# Patient Record
Sex: Male | Born: 1960 | Race: White | Hispanic: No | Marital: Single | Smoking: Current every day smoker
Health system: Southern US, Community
[De-identification: ages and names within clinical notes are randomized; demographics above are authoritative.]

---

## 2015-08-08 ENCOUNTER — Emergency Department (HOSPITAL_COMMUNITY)
Admission: EM | Admit: 2015-08-08 | Discharge: 2015-08-08 | Disposition: A | Discharge status: Home / Self Care | Payer: Non-veteran care | Attending: Emergency Medicine | Admitting: Emergency Medicine

## 2015-08-08 ENCOUNTER — Encounter (HOSPITAL_COMMUNITY): Payer: Self-pay | Admitting: Emergency Medicine

## 2015-08-08 ENCOUNTER — Emergency Department (HOSPITAL_COMMUNITY): Payer: Self-pay

## 2015-08-08 DIAGNOSIS — Y9289 Other specified places as the place of occurrence of the external cause: Secondary | ICD-10-CM | POA: Insufficient documentation

## 2015-08-08 DIAGNOSIS — W1789XA Other fall from one level to another, initial encounter: Secondary | ICD-10-CM | POA: Insufficient documentation

## 2015-08-08 DIAGNOSIS — Y9389 Activity, other specified: Secondary | ICD-10-CM | POA: Insufficient documentation

## 2015-08-08 DIAGNOSIS — F17210 Nicotine dependence, cigarettes, uncomplicated: Secondary | ICD-10-CM | POA: Insufficient documentation

## 2015-08-08 DIAGNOSIS — M4850XA Collapsed vertebra, not elsewhere classified, site unspecified, initial encounter for fracture: Secondary | ICD-10-CM

## 2015-08-08 DIAGNOSIS — S22060A Wedge compression fracture of T7-T8 vertebra, initial encounter for closed fracture: Secondary | ICD-10-CM | POA: Insufficient documentation

## 2015-08-08 DIAGNOSIS — IMO0001 Reserved for inherently not codable concepts without codable children: Secondary | ICD-10-CM

## 2015-08-08 DIAGNOSIS — Y998 Other external cause status: Secondary | ICD-10-CM | POA: Insufficient documentation

## 2015-08-08 MED ORDER — HYDROCODONE-ACETAMINOPHEN 5-325 MG PO TABS
1.0000 | ORAL_TABLET | ORAL | Status: AC | PRN
Start: 2015-08-08 — End: ?

## 2015-08-08 MED ORDER — NAPROXEN 250 MG PO TABS: 250.0000 mg | ORAL_TABLET | Freq: Two times a day (BID) | ORAL | Status: AC

## 2015-08-08 NOTE — ED Notes (Signed)
Patient moving around very well, full ROM.   Patient states had initial fall in October off the back of a porch, was never seen for injury.

## 2015-08-08 NOTE — Discharge Instructions (Signed)
Vertebral Fracture A vertebral fracture is a break in one of the bones that make up the spine (vertebrae). The vertebrae are stacked on top of each other to form the spinal column. They support the body and protect the spinal cord. The vertebral column has an upper part (cervical spine), a middle part (thoracic spine), and a lower part (lumbar spine). Most vertebral fractures occur in the thoracic spine or lumbar spine. There are three main types of vertebral fractures:  Flexion fracture. This happens when vertebrae collapse. Vertebrae can collapse:  In the front (compression fracture). This type of fracture is common in people who have a condition that causes their bones to be weak and brittle (osteoporosis). The fracture can make a person lose height.  In the front and back (axial burst fracture).  Extension fracture. This happens when an external force pulls apart the vertebrae.  Rotation fracture. This happens when the spine bends extremely in one direction. This type can cause a piece of a vertebra to break off (transverse process fracture) or move out of its normal position (fracture dislocation). This type of fracture has a high risk for spinal cord injury. Vertebral fractures can range from mild to very severe. The most severe types are those that cause the broken bones to move out of place (unstable) and those that injure or press on the spinal cord. CAUSES This condition is usually caused by a forceful injury. This type of injury commonly results from:  Car accidents.  Falling or jumping from a great height.  Collisions in contact sports.  Violent acts, such as an assault or a gunshot wound. RISK FACTORS This injury is more likely to happen to people who:  Have osteoporosis.  Participate in contact sports.  Are in situations that could result in falls or other violent injuries. SYMPTOMS Symptoms of this injury depend on the location and the type of fracture. The most common  symptom is back pain that gets worse with movement. You may also have trouble standing or walking. If a fracture has damaged your spinal cord or is pressing on it, you may also have:  Numbness.  Tingling.  Weakness.  Loss of movement.  Loss of bowel or bladder control. DIAGNOSIS This injury may be diagnosed based on symptoms, medical history, and a physical exam. You may also have imaging tests to confirm the diagnosis. These may include:  Spine X-ray.  CT scan.  MRI. TREATMENT Treatment for this injury depends on the type of fracture. If your fracture is stable and does not affect your spinal cord, it may heal with nonsurgical treatment, such as:  Taking pain medicine.  Wearing a cast or a brace.  Doing physical therapy exercises. If your vertebral fracture is unstable or it affects your spinal cord, you may need surgical treatment, such as:  Laminectomy. This procedure involves removing the part of a vertebra that is pushing on the spinal cord (spinal decompression surgery). Bone fragments may also be removed.  Spinal fusion. This procedure is used to stabilize an unstable fracture. Vertebrae may be joined together with a piece of bone from another part of your body (graft) and held in place with rods, plates, or screws.  Vertebroplasty. In this procedure, bone cement is used to rebuild collapsed vertebrae. HOME CARE INSTRUCTIONS General Instructions  Take medicines only as directed by your health care provider.  Do not drive or operate heavy machinery while taking pain medicine.  If directed, apply ice to the injured area:  Put  ice in a plastic bag.  Place a towel between your skin and the bag.  Leave the ice on for 30 minutes every two hours at first. Then apply the ice as needed.  Wear your neck brace or back brace as directed by your health care provider.  Do not drink alcohol. Alcohol can interfere with your treatment.  Keep all follow-up visits as directed  by your health care provider. This is important. It can help to prevent permanent injury, disability, and long-lasting (chronic) pain. Activity  Stay in bed (on bed rest) only as directed by your health care provider. Being on bed rest for too long can make your condition worse.  Return to your normal activities as directed by your health care provider. Ask what activities are safe for you.  Do exercises to improve motion and strength in your back (physical therapy), as recommended by your health care provider.   Exercise regularly as directed by your health care provider. SEEK MEDICAL CARE IF:  You have a fever.  You develop a cough that makes your pain worse.  Your pain medicine is not helping.  Your pain does not get better over time.  You cannot return to your normal activities as planned or expected. SEEK IMMEDIATE MEDICAL CARE IF:  Your pain is very bad and it suddenly gets worse.  You are unable to move any body part (paralysis) that is below the level of your injury.  You have numbness, tingling, or weakness in any body part that is below the level of your injury.  You cannot control your bladder or bowels.   This information is not intended to replace advice given to you by your health care provider. Make sure you discuss any questions you have with your health care provider.   Document Released: 08/15/2004 Document Revised: 11/22/2014 Document Reviewed: 07/13/2014 Elsevier Interactive Patient Education 2016 [employer] Back Pain, Adult Back pain is very common in adults.The cause of back pain is rarely dangerous and the pain often gets better over time.The cause of your back pain may not be known. Some common causes of back pain include:  Strain of the muscles or ligaments supporting the spine.  Wear and tear (degeneration) of the spinal disks.  Arthritis.  Direct injury to the back. For many people, back pain may return. Since back pain is rarely  dangerous, most people can learn to manage this condition on their own. HOME CARE INSTRUCTIONS Watch your back pain for any changes. The following actions may help to lessen any discomfort you are feeling:  Remain active. It is stressful on your back to sit or stand in one place for long periods of time. Do not sit, drive, or stand in one place for more than 30 minutes at a time. Take short walks on even surfaces as soon as you are able.Try to increase the length of time you walk each day.  Exercise regularly as directed by your health care provider. Exercise helps your back heal faster. It also helps avoid future injury by keeping your muscles strong and flexible.  Do not stay in bed.Resting more than 1-2 days can delay your recovery.  Pay attention to your body when you bend and lift. The most comfortable positions are those that put less stress on your recovering back. Always use proper lifting techniques, including:  Bending your knees.  Keeping the load close to your body.  Avoiding twisting.  Find a comfortable position to sleep. Use a firm mattress and  lie on your side with your knees slightly bent. If you lie on your back, put a pillow under your knees.  Avoid feeling anxious or stressed.Stress increases muscle tension and can worsen back pain.It is important to recognize when you are anxious or stressed and learn ways to manage it, such as with exercise.  Take medicines only as directed by your health care provider. Over-the-counter medicines to reduce pain and inflammation are often the most helpful.Your health care provider may prescribe muscle relaxant drugs.These medicines help dull your pain so you can more quickly return to your normal activities and healthy exercise.  Apply ice to the injured area:  Put ice in a plastic bag.  Place a towel between your skin and the bag.  Leave the ice on for 20 minutes, 2-3 times a day for the first 2-3 days. After that, ice and  heat may be alternated to reduce pain and spasms.  Maintain a healthy weight. Excess weight puts extra stress on your back and makes it difficult to maintain good posture. SEEK MEDICAL CARE IF:  You have pain that is not relieved with rest or medicine.  You have increasing pain going down into the legs or buttocks.  You have pain that does not improve in one week.  You have night pain.  You lose weight.  You have a fever or chills. SEEK IMMEDIATE MEDICAL CARE IF:   You develop new bowel or bladder control problems.  You have unusual weakness or numbness in your arms or legs.  You develop nausea or vomiting.  You develop abdominal pain.  You feel faint.   This information is not intended to replace advice given to you by your health care provider. Make sure you discuss any questions you have with your health care provider.   Document Released: 07/08/2005 Document Revised: 07/29/2014 Document Reviewed: 11/09/2013 Elsevier Interactive Patient Education [employer]

## 2015-08-08 NOTE — ED Notes (Signed)
Pt sts mid back pain x 4 months since falling off porch

## 2015-08-08 NOTE — ED Provider Notes (Signed)
CSN: [id number]     Arrival date & time 08/08/15  0802 History  By signing my name below, I, William Vaughn, attest that this documentation has been prepared under the direction and in the presence of non-physician practitioner, Everlene Farrier, PA-C. Electronically Signed: [name], Scribe. 08/08/2015. 9:20 AM.    Chief Complaint  Patient presents with  . Back Pain    The history is provided by the patient. No language interpreter was used.     HPI Comments:  William Vaughn is a 55 y.o. male who presents to the Emergency Department complaining of 10/10 back pain x 3 months.Pt fell backwards off a porch in October 2016. He has taken nothing for treatment today. He has not been evaluated for his back pain yet. He denies numbness and tingling in his extremities, bowel/bladder incontinence, dysuria, hematuria, vomiting, diarrhea and fever. He also denies h/o IVDA and h/o CA. No alleviating factors noted; no treatments tried. Pt has not been evaluated since the fall.    History reviewed. No pertinent past medical history. History reviewed. No pertinent past surgical history. History reviewed. No pertinent family history. Social History  Substance Use Topics  . Smoking status: Current Every Day Smoker  . Smokeless tobacco: None  . Alcohol Use: Yes    Review of Systems  Constitutional: Negative for fever and chills.  Respiratory: Negative for shortness of breath.   Cardiovascular: Negative for chest pain.  Gastrointestinal: Negative for vomiting and diarrhea.  Genitourinary: Negative for dysuria, hematuria and difficulty urinating.  Musculoskeletal: Positive for back pain. Negative for gait problem and neck pain.  Skin: Negative for rash and wound.  Neurological: Negative for weakness and numbness.    Allergies  Review of patient's allergies indicates no known allergies.  Home Medications   Prior to Admission medications   Medication Sig Start Date End Date Taking? Authorizing  Provider  HYDROcodone-acetaminophen (NORCO/VICODIN) 5-325 MG tablet Take 1 tablet by mouth every 4 (four) hours as needed for moderate pain or severe pain. 08/08/15   Everlene Farrier, PA-C  naproxen (NAPROSYN) 250 MG tablet Take 1 tablet (250 mg total) by mouth 2 (two) times daily with a meal. 08/08/15   Everlene Farrier, PA-C   BP 105/70 mmHg  Pulse 90  Temp(Src) 97.9 F (36.6 C) (Oral)  Resp 18  SpO2 99% Physical Exam  Constitutional: He is oriented to person, place, and time. He appears well-developed and well-nourished. No distress.  Nontoxic appearing.  HENT:  Head: Normocephalic and atraumatic.  Mouth/Throat: Oropharynx is clear and moist.  Eyes: Conjunctivae are normal. Pupils are equal, round, and reactive to light. Right eye exhibits no discharge. Left eye exhibits no discharge.  Neck: Normal range of motion. Neck supple.  Cardiovascular: Normal rate, regular rhythm, normal heart sounds and intact distal pulses.   Pulmonary/Chest: Effort normal and breath sounds normal. No respiratory distress. He has no wheezes. He has no rales.  Abdominal: Soft. There is no tenderness.  Musculoskeletal: Normal range of motion. He exhibits tenderness. He exhibits no edema.  Mild Tenderness noted to thoracic spine  No lumbar spine tenderness  5/5 strength to BLE; nml gait Moderate kyphosis  No erythema or edema.  Lymphadenopathy:    He has no cervical adenopathy.  Neurological: He is alert and oriented to person, place, and time. He has normal reflexes. He displays normal reflexes. Coordination normal.  Reflex Scores:      Patellar reflexes are 2+ on the right side and 2+ on the left side. 55  strength in his bilateral lower extremities. Normal gait. Bilateral patellar DTRs are intact.  Skin: Skin is warm and dry. No rash noted. He is not diaphoretic. No erythema. No pallor.  Psychiatric: He has a normal mood and affect. His behavior is normal.  Nursing note and vitals reviewed.   ED Course   Procedures   DIAGNOSTIC STUDIES:  Oxygen Saturation is 97% on RA, normal by my interpretation.    COORDINATION OF CARE:  9:16 AM Pt updated with results. Discussed treatment plan with pt at bedside and pt agreed to plan.   Imaging Review Dg Thoracic Spine 2 View  08/08/2015  CLINICAL DATA:  Mid back pain status post fall from the porch. EXAM: THORACIC SPINE 2 VIEWS COMPARISON:  None. FINDINGS: There are age-indeterminate T8 and T9 vertebral body compression fractures with approximately 80% anterior height loss. The remainder the vertebral body heights are maintained. There is relative kyphosis centered at T8-9. Alignment is normal. No other significant bone abnormalities are identified. IMPRESSION: 1. Age-indeterminate T8 and T9 vertebral body compression fractures with approximately 80% anterior height loss. Electronically Signed   By: Elige Ko   On: 08/08/2015 08:53   Dg Lumbar Spine Complete  08/08/2015  CLINICAL DATA:  Mid back pain 4 months since falling off a porch EXAM: LUMBAR SPINE - COMPLETE 4+ VIEW COMPARISON:  None. FINDINGS: There is no evidence of lumbar spine fracture. There is 3 mm scratch at there is 5 mm of retrolisthesis of L2 on L3. There is mild degenerative disc disease at L2-3. IMPRESSION: No acute osseous injury of the lumbar spine. Electronically Signed   By: Elige Ko   On: 08/08/2015 08:52   I have personally reviewed and evaluated these images as part of my medical decision-making.     MDM   Meds given in ED:  Medications - No data to display  New Prescriptions   HYDROCODONE-ACETAMINOPHEN (NORCO/VICODIN) 5-325 MG TABLET    Take 1 tablet by mouth every 4 (four) hours as needed for moderate pain or severe pain.   NAPROXEN (NAPROSYN) 250 MG TABLET    Take 1 tablet (250 mg total) by mouth 2 (two) times daily with a meal.    Final diagnoses:  Vertebral compression fracture, initial encounter [facility])   This  is a 55 y.o. male who presents to the  Emergency Department complaining of 10/10 back pain x 3 months.Pt fell backwards off a porch in October 2016. He has taken nothing for treatment today. He has not been evaluated for his back pain yet. He denies numbness and tingling in his extremities, bowel/bladder incontinence.  On exam the patient is afebrile nontoxic appearing. He has no focal neurological deficits. He is able to ambulate with normal gait. He has moderate kyphosis on exam. Is some mild thoracic back tenderness to palpation. No back erythema or edema. Patient had lumbar and thoracic x-rays. Thoracic x-ray indicates T8 and T9 vertebral body compression fractures with approximately 80% anterior height loss. Will start patient on Naprosyn and provide with Norco for breakthrough pain. Will provide with follow-up with orthopedic surgeon Dr. Ophelia Charter. I discussed strict return precautions. I advised the patient to follow-up with their primary care provider this week. I advised the patient to return to the emergency department with new or worsening symptoms or new concerns. The patient verbalized understanding and agreement with plan.   This patient was discussed with Dr. Effie Shy who agrees with assessment and plan.  I personally performed the services described in this  documentation, which was scribed in my presence. The recorded information has been reviewed and is accurate.      Everlene Farrier, PA-C 08/08/15 1610  Mancel Bale, MD 08/08/15 [phone]

## 2016-07-04 IMAGING — DX DG LUMBAR SPINE COMPLETE 4+V
6 series · 6 of 6 positions shown · non-contrast
Comparison: None.

CLINICAL DATA: Mid back pain 4 months since falling off a porch

EXAM:
LUMBAR SPINE - COMPLETE 4+ VIEW

[t lumbar spine ap]
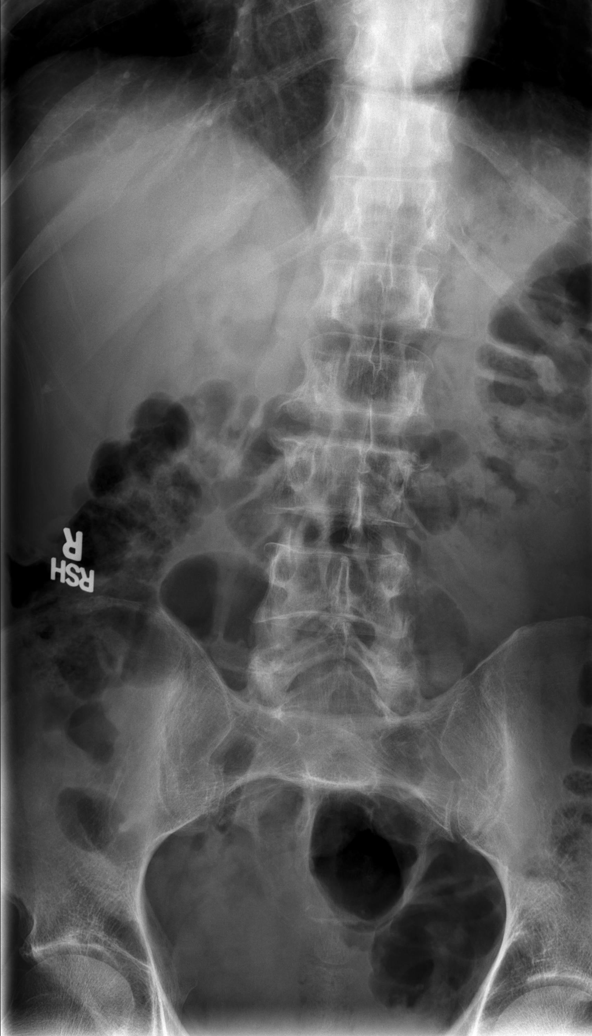

[t lumbar spine obl (1 of 3)]
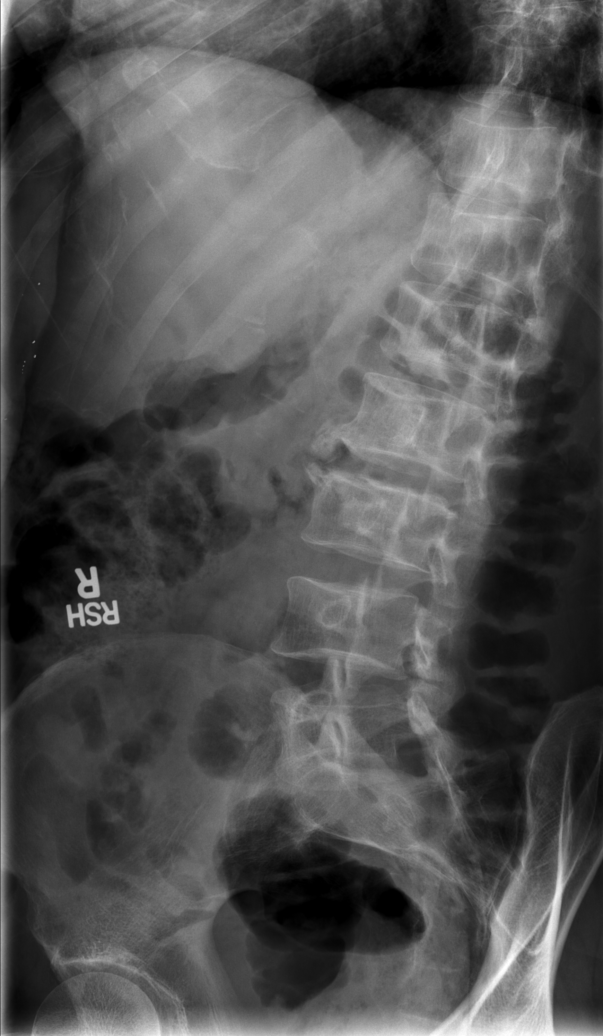

[t lumbar spine obl (2 of 3)]
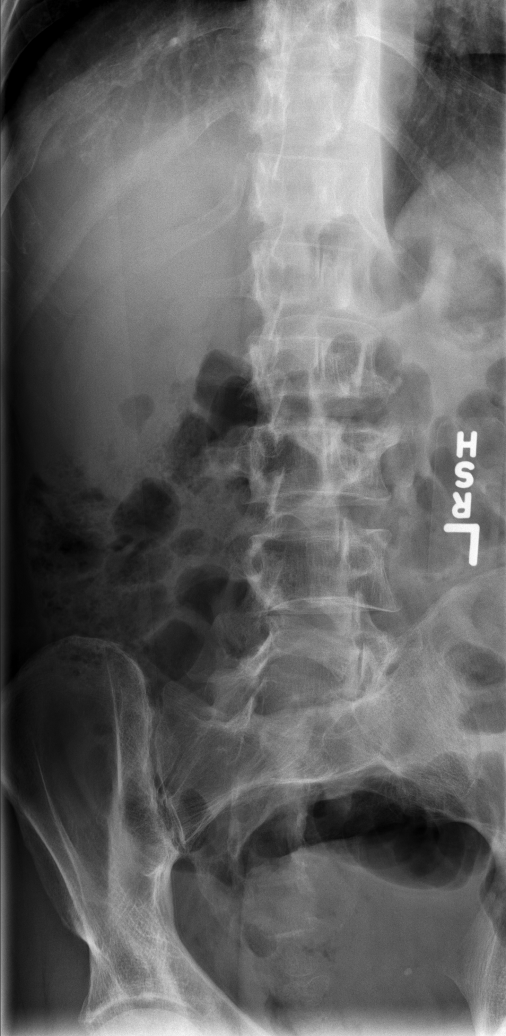

[t lumbar spine obl (3 of 3)]
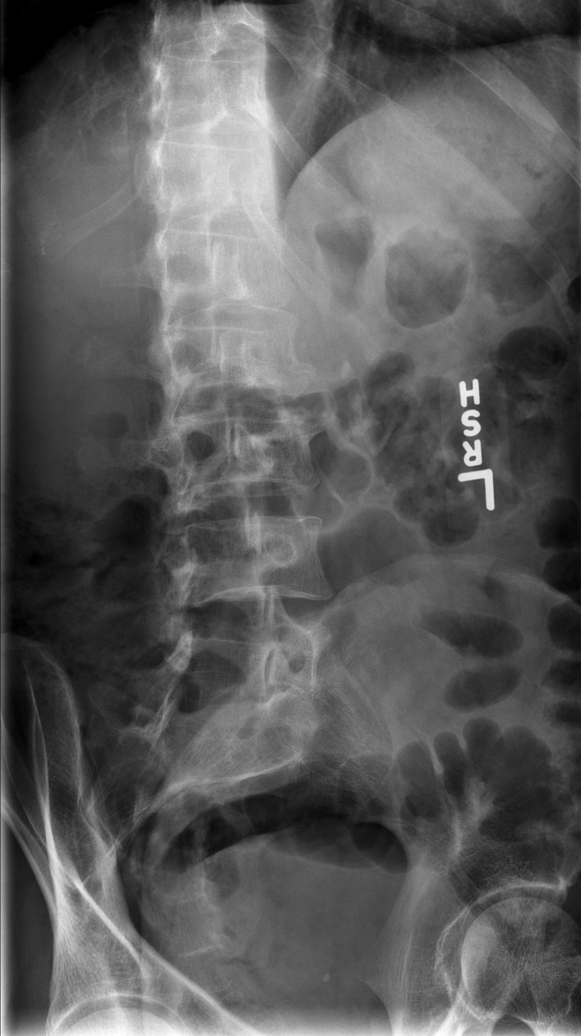

[t lumbar spine lat]
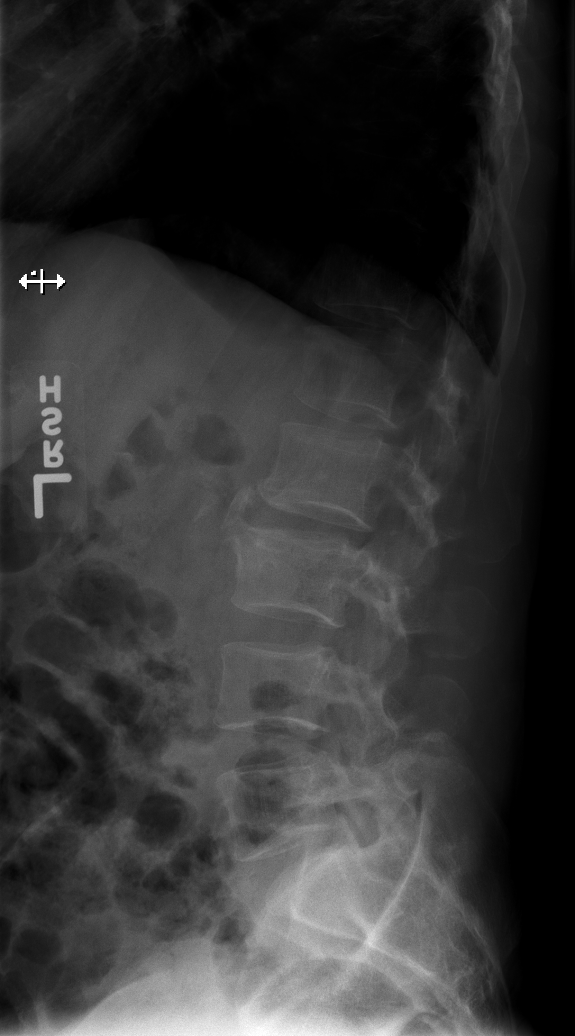

[t lumbar l-5 s-1 spot]
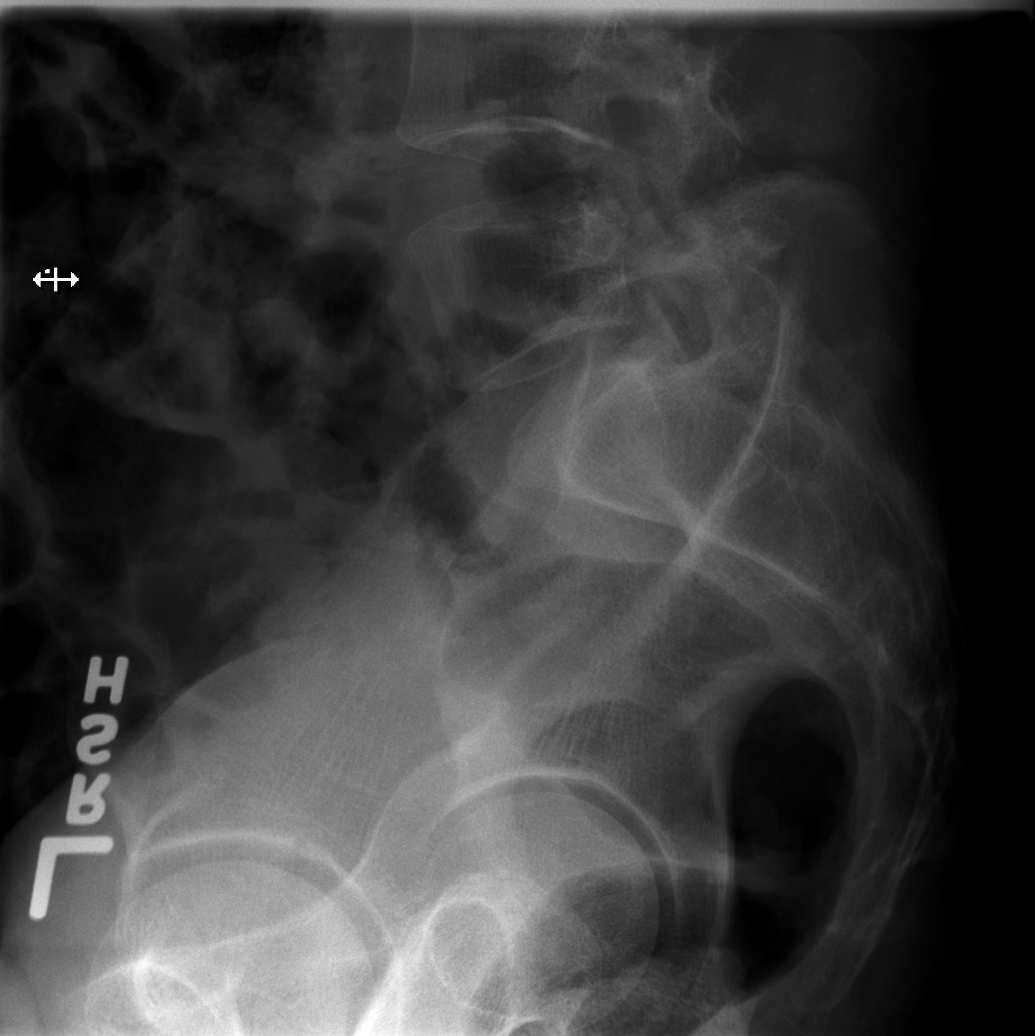

[6 of 6 positions shown; findings below may reference images not displayed]

FINDINGS: There is no evidence of lumbar spine fracture. There is 3 mm scratch
at there is 5 mm of retrolisthesis of L2 on L3. There is mild
degenerative disc disease at L2-3.
IMPRESSION: No acute osseous injury of the lumbar spine.

## 2016-07-04 IMAGING — DX DG THORACIC SPINE 2V
3 series · 3 of 3 positions shown · non-contrast
Comparison: None.

CLINICAL DATA: Mid back pain status post fall from the porch.

EXAM:
THORACIC SPINE 2 VIEWS

[t thoracic spine ap]
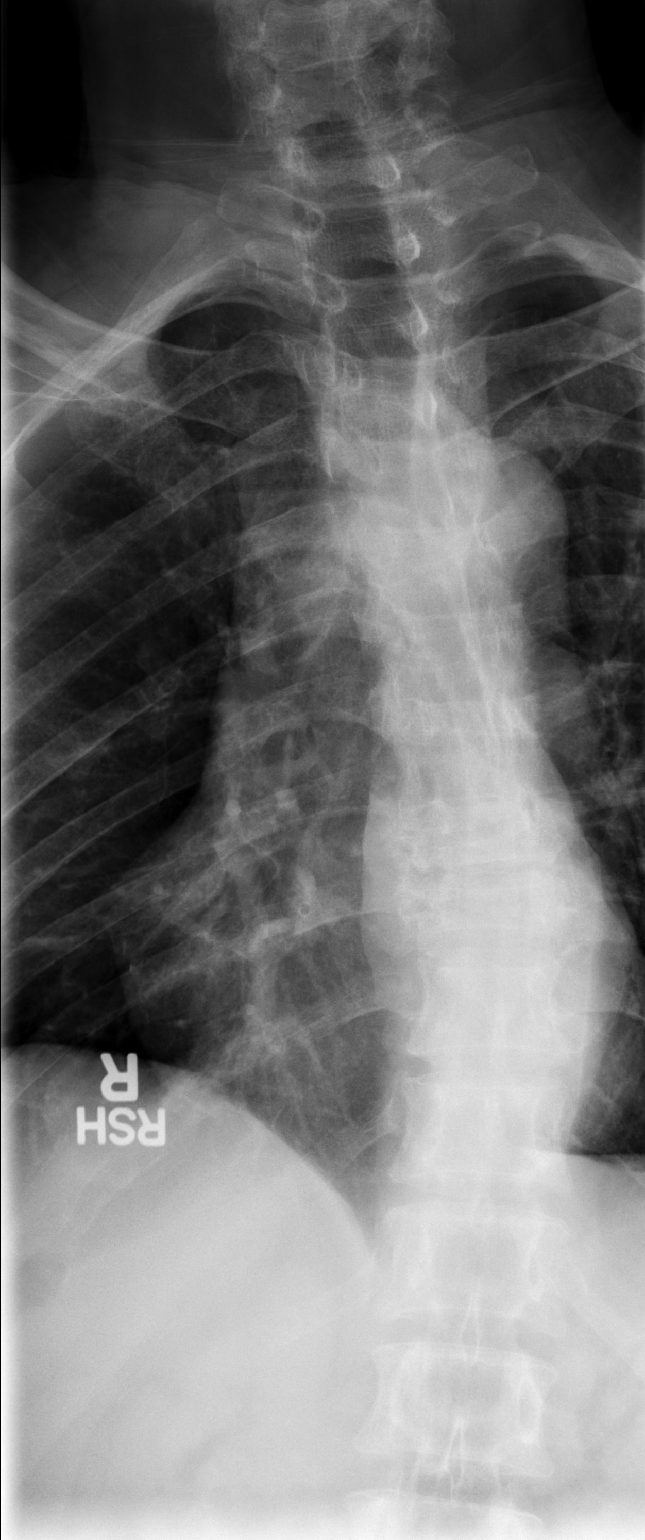

[t thoracic breathing lat]
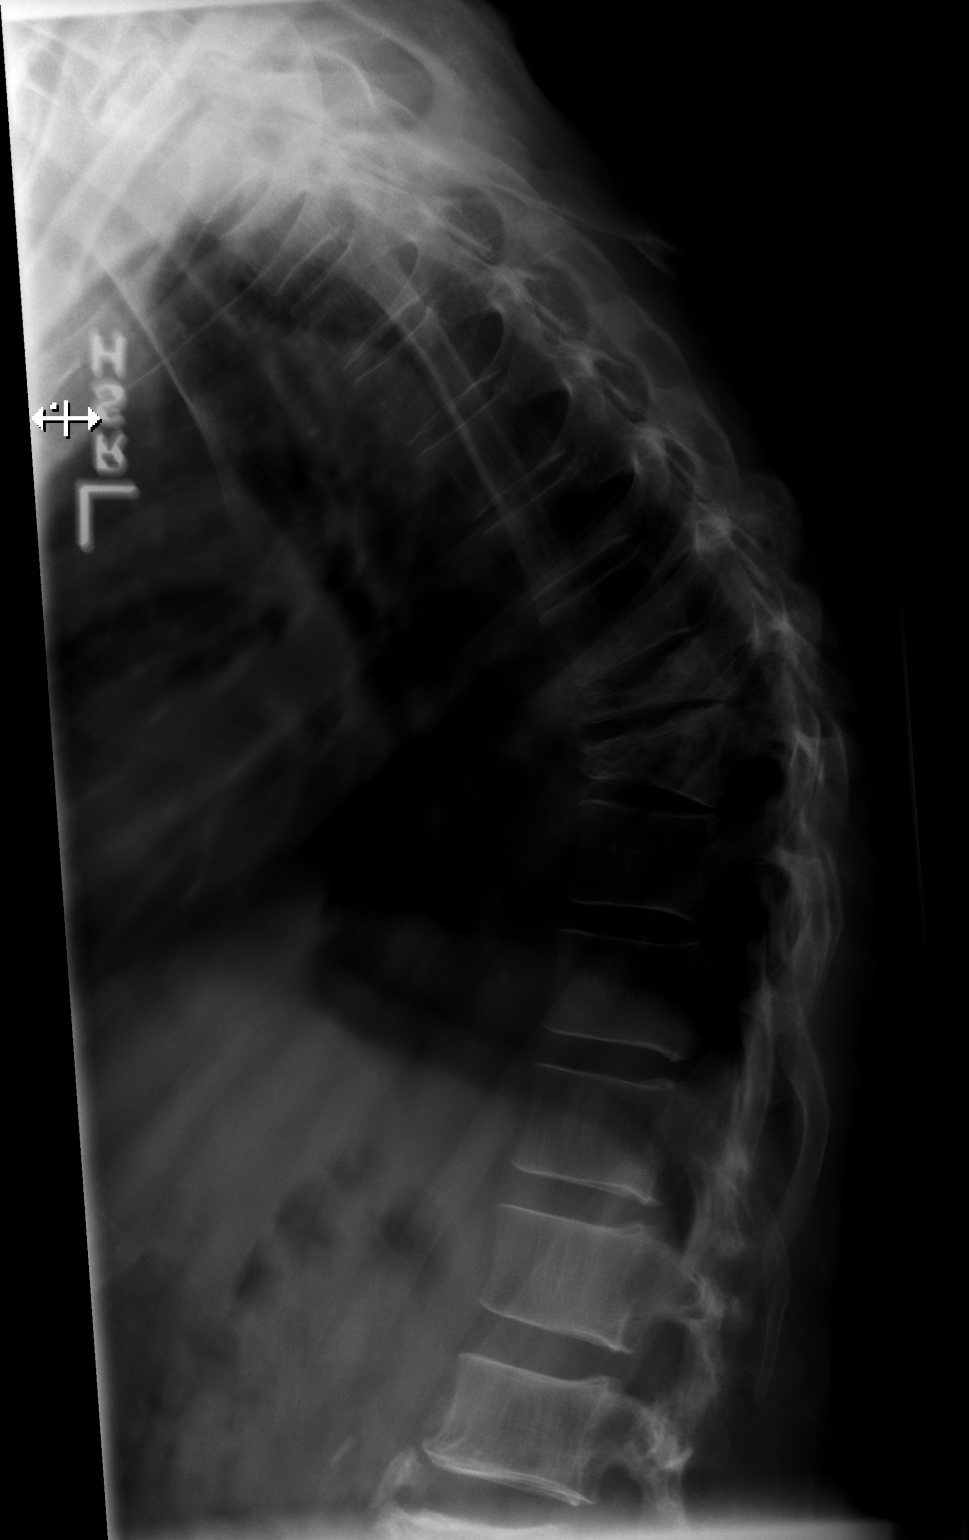

[t thoracic swimmers]
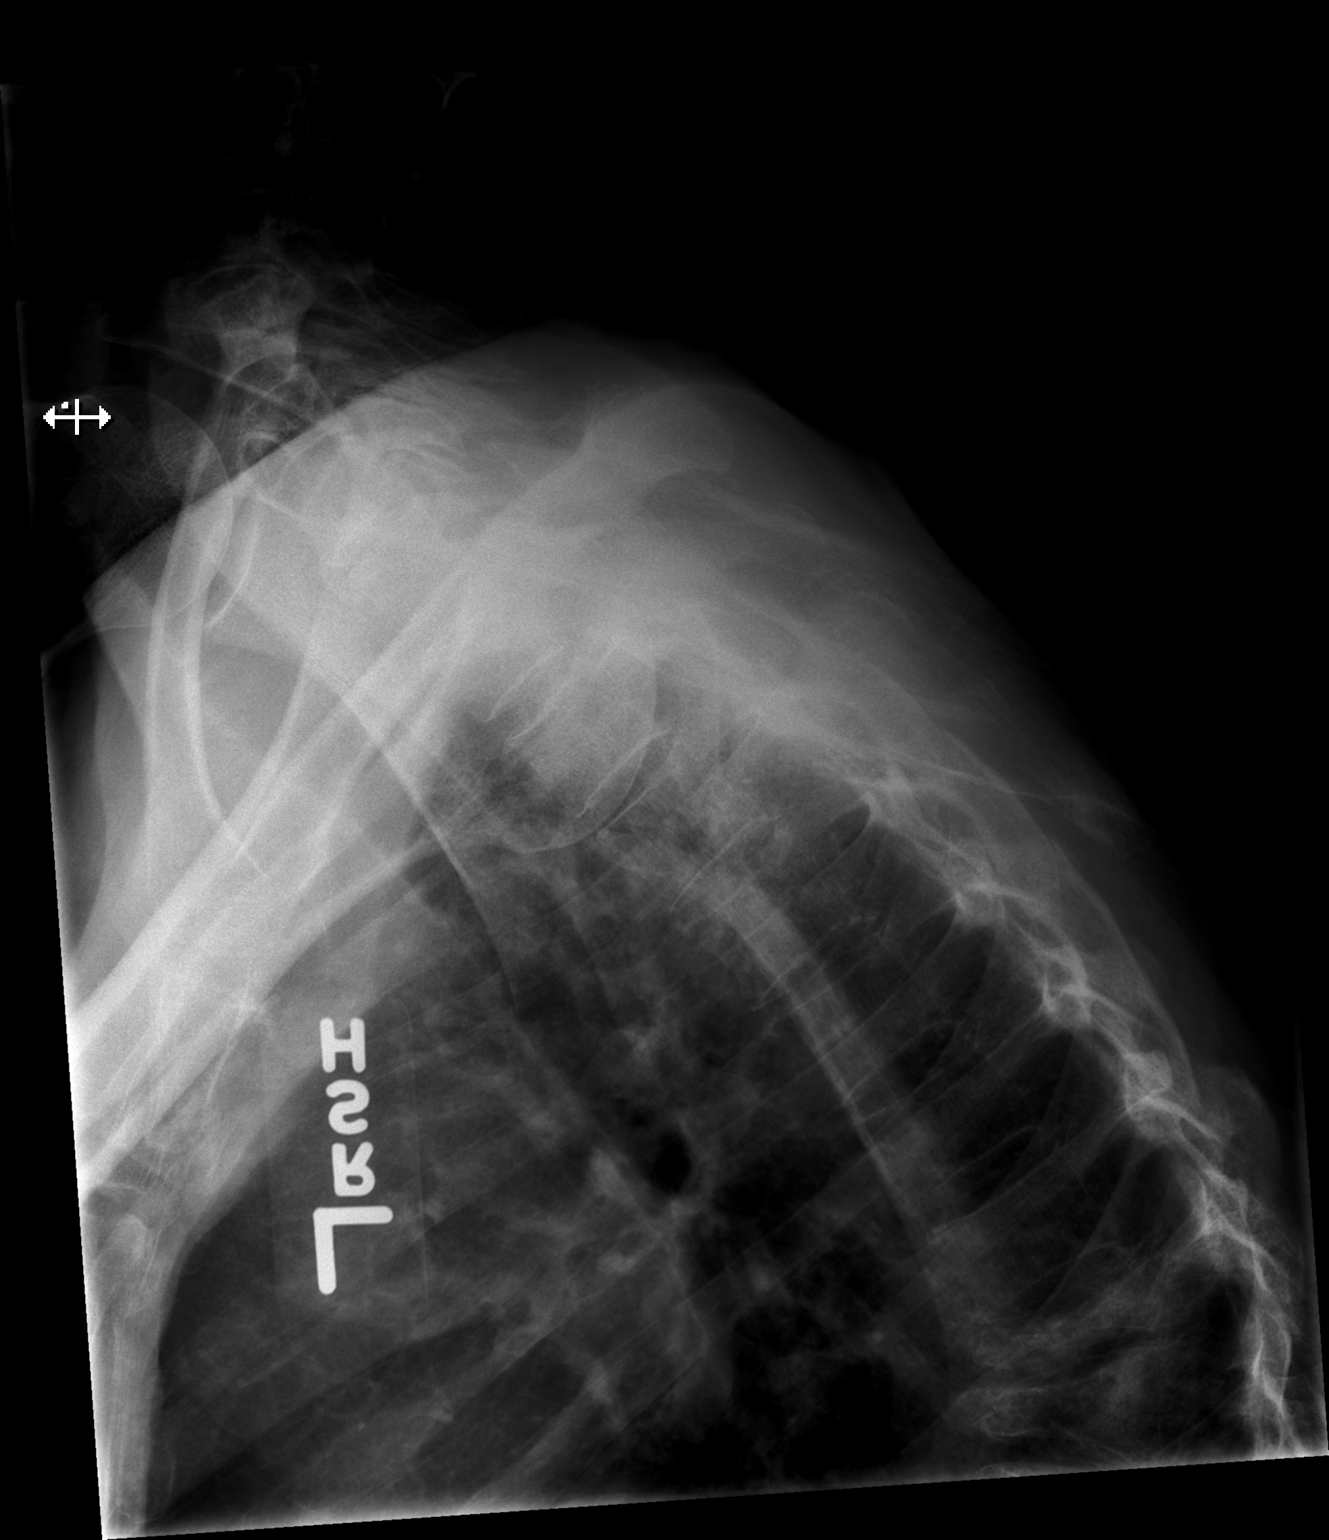

[3 of 3 positions shown; findings below may reference images not displayed]

FINDINGS: There are age-indeterminate T8 and T9 vertebral body compression
fractures with approximately 80% anterior height loss. The remainder
the vertebral body heights are maintained. There is relative
kyphosis centered at T8-9. Alignment is normal. No other significant
bone abnormalities are identified.
IMPRESSION: 1. Age-indeterminate T8 and T9 vertebral body compression fractures
with approximately 80% anterior height loss.

## 2020-08-22 DEATH — deceased
